# Patient Record
Sex: Male | Born: 1955 | Race: Black or African American | Hispanic: No | Marital: Married | Smoking: Current every day smoker
Health system: Southern US, Community
[De-identification: ages and names within clinical notes are randomized; demographics above are authoritative.]

---

## 2014-03-21 ENCOUNTER — Encounter (HOSPITAL_COMMUNITY): Payer: Self-pay | Admitting: Emergency Medicine

## 2014-03-21 ENCOUNTER — Emergency Department (HOSPITAL_COMMUNITY)
Admission: EM | Admit: 2014-03-21 | Discharge: 2014-03-21 | Disposition: A | Discharge status: Home / Self Care | Payer: Self-pay | Attending: Emergency Medicine | Admitting: Emergency Medicine

## 2014-03-21 DIAGNOSIS — K029 Dental caries, unspecified: Secondary | ICD-10-CM | POA: Insufficient documentation

## 2014-03-21 DIAGNOSIS — F17200 Nicotine dependence, unspecified, uncomplicated: Secondary | ICD-10-CM | POA: Insufficient documentation

## 2014-03-21 DIAGNOSIS — Z792 Long term (current) use of antibiotics: Secondary | ICD-10-CM | POA: Insufficient documentation

## 2014-03-21 DIAGNOSIS — R030 Elevated blood-pressure reading, without diagnosis of hypertension: Secondary | ICD-10-CM | POA: Insufficient documentation

## 2014-03-21 DIAGNOSIS — IMO0001 Reserved for inherently not codable concepts without codable children: Secondary | ICD-10-CM

## 2014-03-21 MED ORDER — TRAMADOL HCL 50 MG PO TABS: 50.0000 mg | ORAL_TABLET | Freq: Four times a day (QID) | ORAL | Status: AC | PRN

## 2014-03-21 NOTE — ED Notes (Signed)
PT ambulated with baseline gait; VSS; A&Ox3; no signs of distress; respirations even and unlabored; skin warm and dry; no questions upon discharge.  

## 2014-03-21 NOTE — Discharge Instructions (Signed)
Dental Extraction A dental extraction procedure refers to a routine tooth extraction performed by your dentist. The procedure depends on where and how the tooth is positioned. The procedure can be very quick, sometimes lasting only seconds. Reasons for dental extraction include:  Tooth decay.  Infections (abcesses).  The need to make room for other teeth.  Gum disease s where the supporting bone has been destroyed.  Fractures of the tooth leaving it unrestorable.  Extra teeth (supernumerary) or grossly malformed teeth.  Baby teeththat have not fallen out in time and have not permitted the the permanent teeth to erupt properly.  In preparation for braces where there is not enough room to align the teeth properly.  Not enough room for wisdom teeth (particularly those that are impacted).  Prior to receiving radiation to the head and neck,teeth in the field of radiation may need to be extracted. LET YOUR CAREGIVER KNOW ABOUT:  Any allergies.  All medicines you are taking:  Including herbs, eye drops, over-the-counter medications, and creams.  Blood thinners (anticoagulants), aspirin or other drugs that may affect blood clotting.  Use of steroids (through mouth or as creams).  Previous problems with anesthetics, including local anesthetics.  History of bleeding or blood problems.  Previous surgery.  Possibility of pregnancy if this applies.  Smoking history.  Any health problems. RISKS AND COMPLICATIONS As with any procedure, complications may occur, but they can usually be managed by your caregiver. General surgical complications may include:  Reaction to anesthesia.  Damage to surrounding teeth, nerves, tissues, or structures.  Infection.  Bleeding. With appropriate treatment and care after surgery, the following complications are very uncommon:  Dry socket (blood clot does not form or stay in place over empty socket). This can delay healing.  Incomplete  extraction of roots.  Jawbone injury, pain, or weakness. BEFORE THE PROCEDURE  Your dental care provider will:  Take a medical and dental history.  Take an X-ray to evaluate the circumstances and how to best extract the tooth.  Do an oral exam.  Depending on the situation, antibiotics may be given before or after the extraction, or before and after.  Your caregivers may review the procedure, the local anaesthesia and/or sedation being used, and what to expect after the procedure with you.  If needed, your dentist may give you a form of sedation, either by medicine you swallow, gas, or intravenously (IV). This will help to relieve anxiety. Complicated extractions may require the use of general anaesthesia. It is important to follow your caregiver's instructions prior to your procedure to avoid complications. Steps before your procedure may include:  Alert your caregiver if you feel ill (sore throat, fever, upset stomach, etc.) in the days leading up to your procedure.  Stop taking certain medications for several days prior to your procedure such as blood thinners.  Take certain medications, such as antibiotics.  Avoid eating and drinking for several hours before the procedure. This will help you to avoid complications from the sedation or anaesthesia.  Sign a patient consent form.  Have a friend or family member drive you to the dentist and drive you home after the procedure.  Wear comfortable, loose clothing. Limit makeup and jewelry.  Quit smoking. If you are a smoker, this will raise the chances of a healing problem after your procedure. If you are thinking about quitting, talk to your surgeon about how long before the operation you should stop smoking. You may also get help from your primary caregiver. PROCEDURE  Dental extraction is typically done as an outpatient procedure. IV sedation, local anesthesia, or both may be used. It will keep you comfortable and free of pain  during the procedure.  There are 2 types of extractions:  Simple extraction involves a tooth that is visible in the mouth and above the gum line. After local anesthetic is given by injection, and the area is numbed, the dentist will loosen the tooth with a special instrument (elevator). Then another instrument (forceps) will be used to grasp the tooth and remove it from its socket. During the procedure you will feel some pressure, but you should not feel pain. If you do feel pain, tell your dentist. The open socket will be cleaned. Dressings (gauze) will be placed in the socket to reduce bleeding.  Surgical extractions are used if the tooth has not come into the mouth or the tooth is broken off below the gum line. The dentist will make a cut (incision) in the gum and may have to remove some of the bone around the tooth to aid in the removal of the tooth. After removal, stitches (sutures) may be required to close area to help in healing and control bleeding. For some surgical extractions, you may need a general anesthetic or IV sedation (through the vein). After both types of extractions, you may be given pain medication or other drugs to help healing. Other post operative instructions will be given by your dental caregiver. AFTER THE PROCEDURE  You will have gauze in your mouth where the tooth was removed. Gentle pressure on the gauze for up to 1 hour will help to control bleeding.  A blood clot will begin to form over the open socket. This is normal. Do not touch the area or rinse it.  Your pain will be controlled with medication and self-care.  You will be given detailed instructions for care after surgery. PROGNOSIS While some discomfort is normal after tooth extraction, most patients recover fully in just a few days. SEEK IMMEDIATE DENTAL CARE  You have uncontrolled bleeding, marked swelling, or severe pain.  You develop a fever, difficulty swallowing, or other sever Dental Care and  Dentist Visits Dental care supports good overall health. Regular dental visits can also help you avoid dental pain, bleeding, infection, and other more serious health problems in the future. It is important to keep the mouth healthy because diseases in the teeth, gums, and other oral tissues can spread to other areas of the body. Some problems, such as diabetes, heart disease, and pre-term labor have been associated with poor oral health.  See your dentist every 6 months. If you experience emergency problems such as a toothache or broken tooth, go to the dentist right away. If you see your dentist regularly, you may catch problems early. It is easier to be treated for problems in the early stages.  WHAT TO EXPECT AT A DENTIST VISIT  Your dentist will look for many common oral health problems and recommend proper treatment. At your regular dental visit, you can expect: Gentle cleaning of the teeth and gums. This includes scraping and polishing. This helps to remove the sticky substance around the teeth and gums (plaque). Plaque forms in the mouth shortly after eating. Over time, plaque hardens on the teeth as tartar. If tartar is not removed regularly, it can cause problems. Cleaning also helps remove stains. Periodic X-rays. These pictures of the teeth and supporting bone will help your dentist assess the health of your teeth. Periodic fluoride  treatments. Fluoride is a natural mineral shown to help strengthen teeth. Fluoride treatmentinvolves applying a fluoride gel or varnish to the teeth. It is most commonly done in children. Examination of the mouth, tongue, jaws, teeth, and gums to look for any oral health problems, such as: Cavities (dental caries). This is decay on the tooth caused by plaque, sugar, and acid in the mouth. It is best to catch a cavity when it is small. Inflammation of the gums caused by plaque buildup (gingivitis). Problems with the mouth or malformed or misaligned teeth. Oral  cancer or other diseases of the soft tissues or jaws. KEEP YOUR TEETH AND GUMS HEALTHY For healthy teeth and gums, follow these general guidelines as well as your dentist's specific advice: Have your teeth professionally cleaned at the dentist every 6 months. Brush twice daily with a fluoride toothpaste. Floss your teeth daily. Ask your dentist if you need fluoride supplements, treatments, or fluoride toothpaste. Eat a healthy diet. Reduce foods and drinks with added sugar. Avoid smoking. TREATMENT FOR ORAL HEALTH PROBLEMS If you have oral health problems, treatment varies depending on the conditions present in your teeth and gums. Your caregiver will most likely recommend good oral hygiene at each visit. For cavities, gingivitis, or other oral health disease, your caregiver will perform a procedure to treat the problem. This is typically done at a separate appointment. Sometimes your caregiver will refer you to another dental specialist for specific tooth problems or for surgery. SEEK IMMEDIATE DENTAL CARE IF: You have pain, bleeding, or soreness in the gum, tooth, jaw, or mouth area. A permanent tooth becomes loose or separated from the gum socket. You experience a blow or injury to the mouth or jaw area. Document Released: 05/28/2011 Document Revised: 12/08/2011 Document Reviewed: 05/28/2011 [facility] Patient Information 2015 [place], [place]. This information is not intended to replace advice given to you by your health care provider. Make sure you discuss any questions you have with your health care provider.  e symptoms.  You have questions or concerns. Document Released: 09/15/2005 Document Revised: 12/08/2011 Document Reviewed: 12/20/2010 [facility] Patient Information 2015 [place], [place]. This information is not intended to replace advice given to you by your health care provider. Make sure you discuss any questions you have with your health care provider.

## 2014-03-21 NOTE — ED Provider Notes (Signed)
CSN: [id number]     Arrival date & time 03/21/14  2143 History   First MD Initiated Contact with Patient 03/21/14 2201     This chart was scribed for non-physician practitioner, Junius FinnerErin O'Malley PA-C, working with Lyanne CoKevin M Campos, MD by Arlan OrganAshley Leger, ED Scribe. This patient was seen in room TR07C/TR07C and the patient's care was started at 10:54 PM.   Chief Complaint  Patient presents with  . Dental Pain    The history is provided by the patient. No language interpreter was used.    HPI Comments: Darryl Mercado is a 58 y.o. male who presents to the Emergency Department complaining of constant, moderate R sided dental pain x 3 weeks that is progressively worsening. Pt states he had plans to have tooth extracted. However, he was unable to afford costs. Pt was treated with Ibuprofen and a course of Penicillin to manage symptoms during last visit. He plans to follow up again next week for further evaluation. At this time he denies any fever, chills, or inability to swallow. He has no pertinent past medical history. No other concerns this visit.  History reviewed. No pertinent past medical history. History reviewed. No pertinent past surgical history. History reviewed. No pertinent family history. History  Substance Use Topics  . Smoking status: Current Every Day Smoker  . Smokeless tobacco: Not on file  . Alcohol Use: Yes    Review of Systems  Constitutional: Negative for fever and chills.  HENT: Positive for dental problem. Negative for congestion, ear pain and trouble swallowing.   Respiratory: Negative for cough.   Skin: Negative for rash.  Psychiatric/Behavioral: Negative for confusion.      Allergies  Review of patient's allergies indicates no known allergies.  Home Medications   Prior to Admission medications   Medication Sig Start Date End Date Taking? Authorizing Provider  acetaminophen (TYLENOL) 325 MG tablet Take 650 mg by mouth every 6 (six) hours as needed for mild pain.    Yes Historical Provider, MD  ibuprofen (ADVIL,MOTRIN) 200 MG tablet Take 200 mg by mouth every 6 (six) hours as needed for moderate pain.   Yes Historical Provider, MD  penicillin v potassium (VEETID) 500 MG tablet Take 500 mg by mouth 3 (three) times daily.   Yes Historical Provider, MD  traMADol (ULTRAM) 50 MG tablet Take 1 tablet (50 mg total) by mouth every 6 (six) hours as needed. 03/21/14   Junius FinnerErin O'Malley, PA-C   Triage Vitals: BP 172/116  Pulse 66  Temp(Src) 98.5 F (36.9 C) (Oral)  Resp 16  Ht 5\' 9"  (1.753 m)  Wt 167 lb (75.751 kg)  BMI 24.65 kg/m2  SpO2 99%   Physical Exam  Nursing note and vitals reviewed. Constitutional: He is oriented to person, place, and time. He appears well-developed and well-nourished.  HENT:  Head: Normocephalic and atraumatic.  Diffuse dental decay. Hole in tooth #28. No active discharge.  No drainable gingival abscess present.  Eyes: EOM are normal.  Neck: Normal range of motion.  Cardiovascular: Normal rate.   Pulmonary/Chest: Effort normal.  Musculoskeletal: Normal range of motion.  Neurological: He is alert and oriented to person, place, and time.  Skin: Skin is warm and dry.  Psychiatric: He has a normal mood and affect. His behavior is normal.    ED Course  Procedures (including critical care time)  DIAGNOSTIC STUDIES: Oxygen Saturation is 99% on RA, Normal by my interpretation.    COORDINATION OF CARE: 10:57 PM- Will give Tramadol to manage  symptoms. Discussed treatment plan with pt at bedside and pt agreed to plan.     Labs Review Labs Reviewed - No data to display  Imaging Review No results found.   EKG Interpretation None      MDM   Final diagnoses:  Dental decay  Pain due to dental caries  Elevated blood pressure   Pt c/o dental pain. Diffuse dental decay present. Pt states he just completed PCN.  Will give pt tramadol for pain and encouraged to f/u with dentist next week as previously scheduled for tooth removal.   Pt also found to have elevated BP during ED visit. Pt states he is unaware of hx of HTN.  Advised to f/u with PCP for recheck.  Return precautions provided. Pt verbalized understanding and agreement with tx plan.   I personally performed the services described in this documentation, which was scribed in my presence. The recorded information has been reviewed and is accurate.    Junius Finnerrin O'Malley, PA-C 03/22/14 0001

## 2014-03-21 NOTE — ED Notes (Signed)
Pt states dental pain was suppose to have tooth extracted but could not afford it this month.

## 2014-03-24 NOTE — ED Provider Notes (Signed)
Medical screening examination/treatment/procedure(s) were performed by non-physician practitioner and as supervising physician I was immediately available for consultation/collaboration.   EKG Interpretation None        Cuauhtemoc Huegel M Maegan Buller, MD 03/24/14 1836 

## 2014-10-22 ENCOUNTER — Encounter (HOSPITAL_COMMUNITY): Payer: Self-pay | Admitting: Family Medicine

## 2014-10-22 ENCOUNTER — Emergency Department (HOSPITAL_COMMUNITY): Payer: Self-pay

## 2014-10-22 ENCOUNTER — Emergency Department (HOSPITAL_COMMUNITY)
Admission: EM | Admit: 2014-10-22 | Discharge: 2014-10-22 | Disposition: A | Discharge status: Home / Self Care | Payer: Self-pay | Attending: Emergency Medicine | Admitting: Emergency Medicine

## 2014-10-22 DIAGNOSIS — W1839XA Other fall on same level, initial encounter: Secondary | ICD-10-CM | POA: Insufficient documentation

## 2014-10-22 DIAGNOSIS — S8262XA Displaced fracture of lateral malleolus of left fibula, initial encounter for closed fracture: Secondary | ICD-10-CM | POA: Insufficient documentation

## 2014-10-22 DIAGNOSIS — S82402A Unspecified fracture of shaft of left fibula, initial encounter for closed fracture: Secondary | ICD-10-CM

## 2014-10-22 DIAGNOSIS — Y9289 Other specified places as the place of occurrence of the external cause: Secondary | ICD-10-CM | POA: Insufficient documentation

## 2014-10-22 DIAGNOSIS — Z720 Tobacco use: Secondary | ICD-10-CM | POA: Insufficient documentation

## 2014-10-22 DIAGNOSIS — Z791 Long term (current) use of non-steroidal anti-inflammatories (NSAID): Secondary | ICD-10-CM | POA: Insufficient documentation

## 2014-10-22 DIAGNOSIS — Y9389 Activity, other specified: Secondary | ICD-10-CM | POA: Insufficient documentation

## 2014-10-22 DIAGNOSIS — Y998 Other external cause status: Secondary | ICD-10-CM | POA: Insufficient documentation

## 2014-10-22 MED ORDER — HYDROCODONE-ACETAMINOPHEN 5-325 MG PO TABS: 1.0000 | ORAL_TABLET | Freq: Four times a day (QID) | ORAL | Status: AC | PRN

## 2014-10-22 MED ORDER — HYDROCODONE-ACETAMINOPHEN 5-325 MG PO TABS
2.0000 | ORAL_TABLET | Freq: Once | ORAL | Status: AC
  Administered 2014-10-22: 2 via ORAL
  Filled 2014-10-22: qty 2

## 2014-10-22 NOTE — Progress Notes (Signed)
Orthopedic Tech Progress Note Patient Details:  Darryl Mercado [birth year] [id number]  Ortho Devices Type of Ortho Device: Ace wrap, Crutches, Post (short leg) splint, Stirrup splint Ortho Device/Splint Location: lle Ortho Device/Splint Interventions: Application   Darryl Mercado 10/22/2014, 2:00 PM

## 2014-10-22 NOTE — ED Provider Notes (Signed)
CSN: [id number]     Arrival date & time 10/22/14  1201 History  This chart was scribed for non-physician practitioner Santiago GladHeather Colman Birdwell, PA-C working with Audree CamelScott T Goldston, MD by Conchita ParisNadim Abuhashem, ED Scribe. This patient was seen in TR07C/TR07C and the patient's care was started at 12:56 PM.   Chief Complaint  Patient presents with  . Ankle Pain  . Foot Pain   Patient is a 59 y.o. male presenting with ankle pain and lower extremity pain. The history is provided by the patient. No language interpreter was used.  Ankle Pain Associated symptoms: no back pain   Foot Pain    HPI Comments: Darryl Mercado is a 59 y.o. male who presents to the Emergency Department complaining of left ankle pain, onset 10:00 AM today. Pt twisted his ankle and fell. He reports falling on his left ankle. Pt has trouble with ambulating and left ankle swelling as associated symptoms. He has not taken anything for relief. Pt denies numbness, pain in knees or back, head trauma or LOC.   History reviewed. No pertinent past medical history. History reviewed. No pertinent past surgical history. History reviewed. No pertinent family history. History  Substance Use Topics  . Smoking status: Current Every Day Smoker  . Smokeless tobacco: Not on file  . Alcohol Use: Yes    Review of Systems  Musculoskeletal: Positive for joint swelling, arthralgias and gait problem. Negative for back pain.  Neurological: Negative for numbness.   Allergies  Review of patient's allergies indicates no known allergies.  Home Medications   Prior to Admission medications   Medication Sig Start Date End Date Taking? Authorizing Provider  acetaminophen (TYLENOL) 325 MG tablet Take 650 mg by mouth every 6 (six) hours as needed for mild pain.    Historical Provider, MD  ibuprofen (ADVIL,MOTRIN) 200 MG tablet Take 200 mg by mouth every 6 (six) hours as needed for moderate pain.    Historical Provider, MD  penicillin v potassium (VEETID) 500 MG  tablet Take 500 mg by mouth 3 (three) times daily.    Historical Provider, MD  traMADol (ULTRAM) 50 MG tablet Take 1 tablet (50 mg total) by mouth every 6 (six) hours as needed. 03/21/14   Junius FinnerErin O'Malley, PA-C   BP 138/91 mmHg  Pulse 95  Temp(Src) 97.6 F (36.4 C)  Resp 18  SpO2 95% Physical Exam  Constitutional: He is oriented to person, place, and time. He appears well-developed and well-nourished. No distress.  HENT:  Head: Normocephalic and atraumatic.  Eyes: Pupils are equal, round, and reactive to light.  Neck: Normal range of motion.  Cardiovascular: Normal rate, regular rhythm and normal heart sounds.   Pulses:      Dorsalis pedis pulses are 2+ on the left side.  Pulmonary/Chest: Effort normal and breath sounds normal. No respiratory distress.  Musculoskeletal: Normal range of motion.  Left lateral malleolus is TTP with associated edema. ROM is limited secondary to pain. He is able to wiggle left toes. Distal sensation of left toes intact. Full ROM of left knee and left hip.  Neurological: He is alert and oriented to person, place, and time.  Skin: Skin is warm and dry.  Psychiatric: He has a normal mood and affect. His behavior is normal.  Nursing note and vitals reviewed.   ED Course  Procedures  DIAGNOSTIC STUDIES: Oxygen Saturation is 95% on room air, normal by my interpretation.    COORDINATION OF CARE: 1:01 PM Discussed treatment plan with pt at bedside and  pt agreed to plan.  Labs Review Labs Reviewed - No data to display  Imaging Review Dg Ankle Complete Left  10/22/2014   CLINICAL DATA:  Twisting injury left ankle.  Lateral pain.  EXAM: LEFT ANKLE COMPLETE - 3+ VIEW  COMPARISON:  None.  FINDINGS: Oblique fracture lateral malleolus with overlying soft tissue swelling. No widening of the medial mortise. No visible medial malleolar or posterior malleolar fracture.  IMPRESSION: 1. Oblique fracture of lateral malleolus, without widening of the medial mortise or  additional visible fracture.   Electronically Signed   By: Herbie Baltimore M.D.   On: 10/22/2014 12:54     EKG Interpretation None      MDM   Final diagnoses:  None   Patient presents today with left ankle pain that has been present since rolling his ankle today.  Xray showing oblique fracture of the lateral malleolus without widening of the medial mortise.  Fracture is closed.  Neurovascularly intact.  Patient given splint and crutches.  Patient given follow up with Orthopedics.  Return precautions given.     Santiago Glad, PA-C 10/22/14 1508  Audree Camel, MD 10/25/14 [phone]

## 2014-10-22 NOTE — ED Notes (Signed)
Pt having left ankle and foot pain. sts twisted it PTA.

## 2014-10-29 ENCOUNTER — Emergency Department (HOSPITAL_COMMUNITY)
Admission: EM | Admit: 2014-10-29 | Discharge: 2014-10-29 | Disposition: A | Discharge status: Home / Self Care | Payer: Self-pay | Attending: Emergency Medicine | Admitting: Emergency Medicine

## 2014-10-29 ENCOUNTER — Encounter (HOSPITAL_COMMUNITY): Payer: Self-pay | Admitting: *Deleted

## 2014-10-29 DIAGNOSIS — X58XXXD Exposure to other specified factors, subsequent encounter: Secondary | ICD-10-CM | POA: Insufficient documentation

## 2014-10-29 DIAGNOSIS — Z720 Tobacco use: Secondary | ICD-10-CM | POA: Insufficient documentation

## 2014-10-29 DIAGNOSIS — S82402D Unspecified fracture of shaft of left fibula, subsequent encounter for closed fracture with routine healing: Secondary | ICD-10-CM | POA: Insufficient documentation

## 2014-10-29 DIAGNOSIS — Z792 Long term (current) use of antibiotics: Secondary | ICD-10-CM | POA: Insufficient documentation

## 2014-10-29 NOTE — Discharge Instructions (Signed)
Please read and follow all provided instructions.  Your diagnoses today include:  1. Fibula fracture, left, closed, initial encounter     Tests performed today include:  Vital signs. See below for your results today.   Medications prescribed:   None  Take any prescribed medications only as directed.  Home care instructions:   Follow any educational materials contained in this packet  Follow R.I.C.E. Protocol:  R - rest your injury   I  - use ice on injury without applying directly to skin  C - compress injury with bandage or splint  E - elevate the injury as much as possible  Follow-up instructions: Call Dr. Veda Canninghandler's office tomorrow for an appointment. If they deny you, ask to speak with an office manager to explain the situation.   Return instructions:   Please return if your toes or feet are numb or tingling, appear gray or blue, or you have severe pain (also elevate the leg and loosen splint or wrap if you were given one)  Please return to the Emergency Department if you experience worsening symptoms.   Please return if you have any other emergent concerns.  Additional Information:  Your vital signs today were: BP 168/91 mmHg   Pulse 80   Temp(Src) 98 F (36.7 C) (Oral)   Resp 15   SpO2 99% If your blood pressure (BP) was elevated above 135/85 this visit, please have this repeated by your doctor within one month. --------------

## 2014-10-29 NOTE — ED Notes (Signed)
Pt returns today because he does not have insurance and can not follow up with Ortho. Pt reports he still has ankle pain 7/10 . Pt also reports his job wants to know when he can return to work.

## 2014-10-29 NOTE — ED Provider Notes (Signed)
CSN: [id number]     Arrival date & time 10/29/14  [id number] History   First MD Initiated Contact with Patient 10/29/14 [phone]     Chief Complaint  Patient presents with  . Ankle Pain     (Consider location/radiation/quality/duration/timing/severity/associated sxs/prior Treatment) HPI Comments: Patient presents with complaint of left ankle pain. Patient was seen in emergency department one week ago and was diagnosed with a left fibular fracture. He was placed in a splint and discharged home with crutches and pain medication. Patient has been taking Vicodin with relief of pain. He states that he called the office of Dr. Ave Filterhandler who was on-call that day, he was unable to get an appointment for follow-up. He returns today here for follow-up. He denies numbness or tingling in his foot. He uses the pain medications on an as-needed basis. He also questions when he will be able to return to work. No other medical complaints.  Patient is a 59 y.o. male presenting with ankle pain. The history is provided by the patient and medical records.  Ankle Pain Associated symptoms: no back pain and no neck pain     History reviewed. No pertinent past medical history. History reviewed. No pertinent past surgical history. History reviewed. No pertinent family history. History  Substance Use Topics  . Smoking status: Current Every Day Smoker  . Smokeless tobacco: Not on file  . Alcohol Use: Yes    Review of Systems  Constitutional: Negative for activity change.  Musculoskeletal: Positive for arthralgias. Negative for back pain, joint swelling, gait problem and neck pain.  Skin: Negative for wound.  Neurological: Negative for weakness and numbness.      Allergies  Review of patient's allergies indicates no known allergies.  Home Medications   Prior to Admission medications   Medication Sig Start Date End Date Taking? Authorizing Provider  acetaminophen (TYLENOL) 325 MG tablet Take 650 mg by mouth every  6 (six) hours as needed for mild pain.    Historical Provider, MD  HYDROcodone-acetaminophen (NORCO/VICODIN) 5-325 MG per tablet Take 1-2 tablets by mouth every 6 (six) hours as needed. 10/22/14   Heather Laisure, PA-C  ibuprofen (ADVIL,MOTRIN) 200 MG tablet Take 200 mg by mouth every 6 (six) hours as needed for moderate pain.    Historical Provider, MD  penicillin v potassium (VEETID) 500 MG tablet Take 500 mg by mouth 3 (three) times daily.    Historical Provider, MD  traMADol (ULTRAM) 50 MG tablet Take 1 tablet (50 mg total) by mouth every 6 (six) hours as needed. 03/21/14   Junius FinnerErin O'Malley, PA-C   BP 168/91 mmHg  Pulse 80  Temp(Src) 98 F (36.7 C) (Oral)  Resp 15  SpO2 99% Physical Exam  Constitutional: He appears well-developed and well-nourished.  HENT:  Head: Normocephalic and atraumatic.  Eyes: Conjunctivae are normal.  Neck: Normal range of motion. Neck supple.  Cardiovascular: Normal pulses.   Musculoskeletal: He exhibits no edema or tenderness.  Left short leg splint in place. Toes exposed with normal capillary refill. Splint not removed. Patient does not have any apparent excessive swelling or pain.  Neurological: He is alert. No sensory deficit.  Motor, sensation, and vascular distal to the injury is fully intact.   Skin: Skin is warm and dry.  Psychiatric: He has a normal mood and affect.  Nursing note and vitals reviewed.   ED Course  Procedures (including critical care time) Labs Review Labs Reviewed - No data to display  Imaging Review No results found.  EKG Interpretation None      9:27 AM Patient seen and examined. Discussed with Dr. Jeraldine Loots.  Vital signs reviewed and are as follows: BP 168/91 mmHg  Pulse 80  Temp(Src) 98 F (36.7 C) (Oral)  Resp 15  SpO2 99%  Note sent to go for orthopedic on patient's behalf asking for a follow-up appointment. Patient asked to call tomorrow for an appointment, speak with office staff manager if he is not able to  schedule an appointment. Also given a work note stating that he cannot return until cleared by orthopedist.  MDM   Final diagnoses:  Fibula fracture, left, closed, with routine healing, subsequent encounter   Patient returns to emergency department for follow-up after he was unable to get a follow-up appointment with the orthopedist.  Plan as above. No apparent complications with healing at this point. Patient has pain medication at home.     Renne Crigler, PA-C 10/29/14 [id number]  Gerhard Munch, MD 10/29/14 [phone]

## 2015-02-22 ENCOUNTER — Emergency Department (HOSPITAL_COMMUNITY)
Admission: EM | Admit: 2015-02-22 | Discharge: 2015-02-22 | Disposition: A | Discharge status: Home / Self Care | Payer: No Typology Code available for payment source | Attending: Emergency Medicine | Admitting: Emergency Medicine

## 2015-02-22 ENCOUNTER — Encounter (HOSPITAL_COMMUNITY): Payer: Self-pay | Admitting: Emergency Medicine

## 2015-02-22 DIAGNOSIS — Y9389 Activity, other specified: Secondary | ICD-10-CM | POA: Diagnosis not present

## 2015-02-22 DIAGNOSIS — Z792 Long term (current) use of antibiotics: Secondary | ICD-10-CM | POA: Diagnosis not present

## 2015-02-22 DIAGNOSIS — I10 Essential (primary) hypertension: Secondary | ICD-10-CM | POA: Insufficient documentation

## 2015-02-22 DIAGNOSIS — Y998 Other external cause status: Secondary | ICD-10-CM | POA: Insufficient documentation

## 2015-02-22 DIAGNOSIS — Z720 Tobacco use: Secondary | ICD-10-CM | POA: Insufficient documentation

## 2015-02-22 DIAGNOSIS — S199XXA Unspecified injury of neck, initial encounter: Secondary | ICD-10-CM | POA: Diagnosis not present

## 2015-02-22 DIAGNOSIS — Y92410 Unspecified street and highway as the place of occurrence of the external cause: Secondary | ICD-10-CM | POA: Diagnosis not present

## 2015-02-22 DIAGNOSIS — Z79899 Other long term (current) drug therapy: Secondary | ICD-10-CM | POA: Insufficient documentation

## 2015-02-22 LAB — BASIC METABOLIC PANEL
Anion gap: 5 (ref 5–15)
BUN: 13 mg/dL (ref 6–20)
CALCIUM: 9 mg/dL (ref 8.9–10.3)
CO2: 30 mmol/L (ref 22–32)
Chloride: 107 mmol/L (ref 101–111)
Creatinine, Ser: 1.12 mg/dL (ref 0.61–1.24)
GFR calc Af Amer: >60 mL/min (ref >60)
GFR calc non Af Amer: >60 mL/min (ref >60)
GLUCOSE: 80 mg/dL (ref 65–99)
Potassium: 3.8 mmol/L (ref 3.5–5.1)
Sodium: 142 mmol/L (ref 135–145)

## 2015-02-22 LAB — CBC WITH DIFFERENTIAL/PLATELET
BASOS ABS: 0 10*3/uL (ref 0.0–0.1)
Basophils Relative: 1 % (ref 0–1)
Eosinophils Absolute: 0 10*3/uL (ref 0.0–0.7)
Eosinophils Relative: 1 % (ref 0–5)
HEMATOCRIT: 40.1 % (ref 39.0–52.0)
HEMOGLOBIN: 13.2 g/dL (ref 13.0–17.0)
LYMPHS PCT: 35 % (ref 12–46)
Lymphs Abs: 2.1 10*3/uL (ref 0.7–4.0)
MCH: 28.6 pg (ref 26.0–34.0)
MCHC: 32.9 g/dL (ref 30.0–36.0)
MCV: 87 fL (ref 78.0–100.0)
MONO ABS: 0.4 10*3/uL (ref 0.1–1.0)
MONOS PCT: 6 % (ref 3–12)
NEUTROS ABS: 3.5 10*3/uL (ref 1.7–7.7)
NEUTROS PCT: 57 % (ref 43–77)
Platelets: 243 10*3/uL (ref 150–400)
RBC: 4.61 MIL/uL (ref 4.22–5.81)
RDW: 14.4 % (ref 11.5–15.5)
WBC: 6.1 10*3/uL (ref 4.0–10.5)

## 2015-02-22 MED ORDER — AMLODIPINE BESYLATE 5 MG PO TABS
5.0000 mg | ORAL_TABLET | Freq: Every day | ORAL | Status: AC
Start: 2015-02-22 — End: ?
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
## 2015-07-03 IMAGING — CR DG ANKLE COMPLETE 3+V*L*
3 series · 3 of 3 positions shown · non-contrast
Comparison: None.

CLINICAL DATA: Twisting injury left ankle.  Lateral pain.

EXAM:
LEFT ANKLE COMPLETE - 3+ VIEW

[ankle ap]
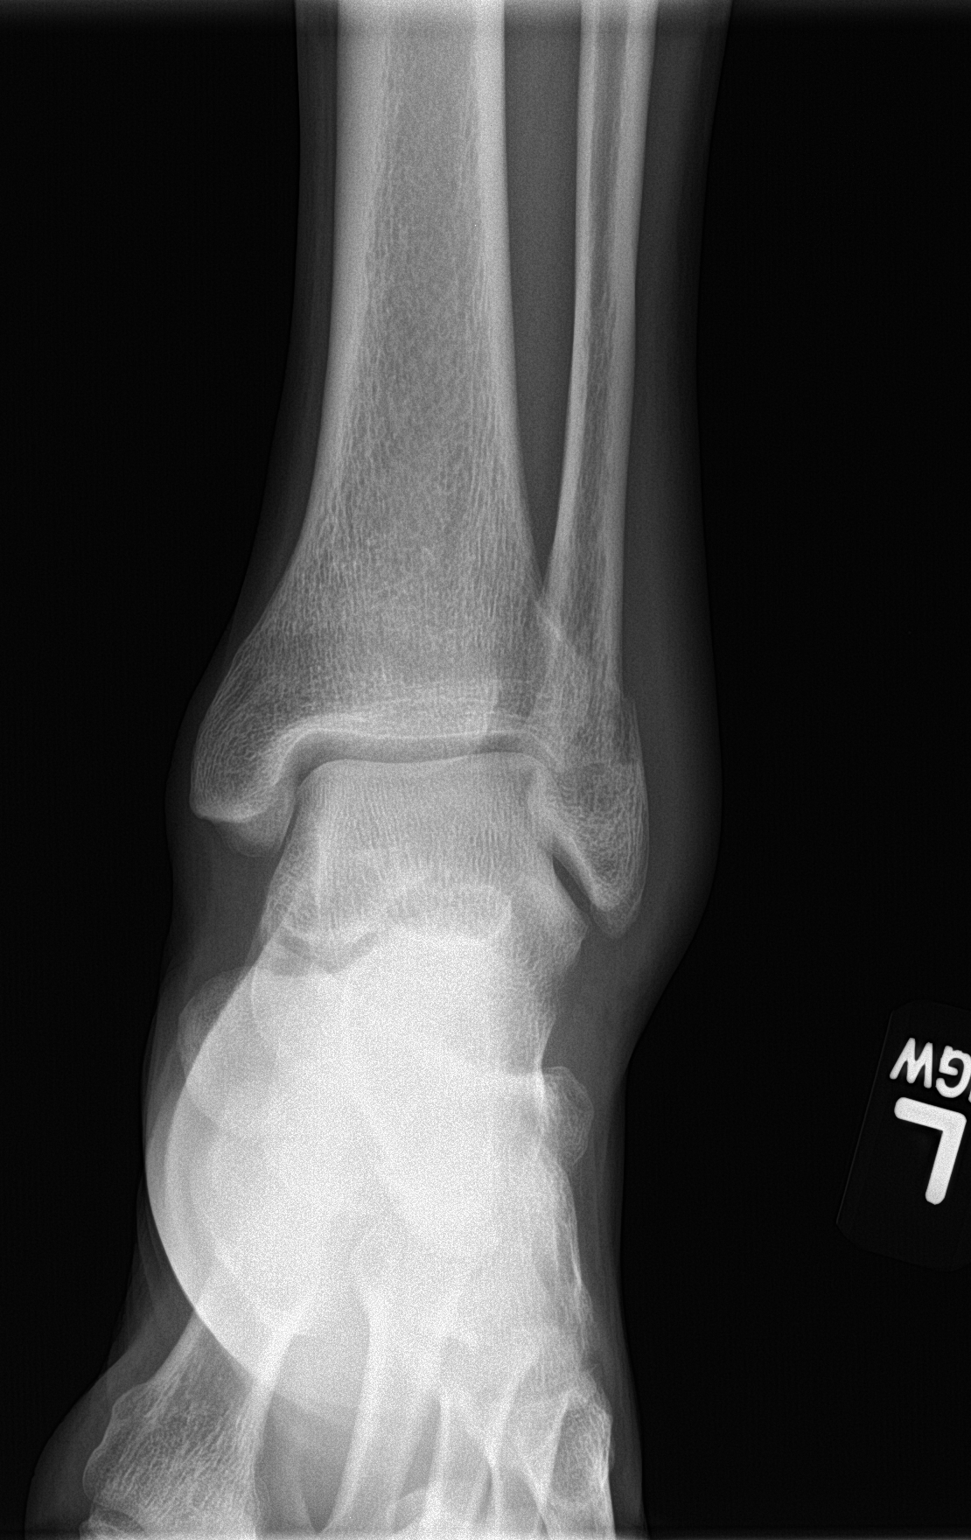

[ankle obl]
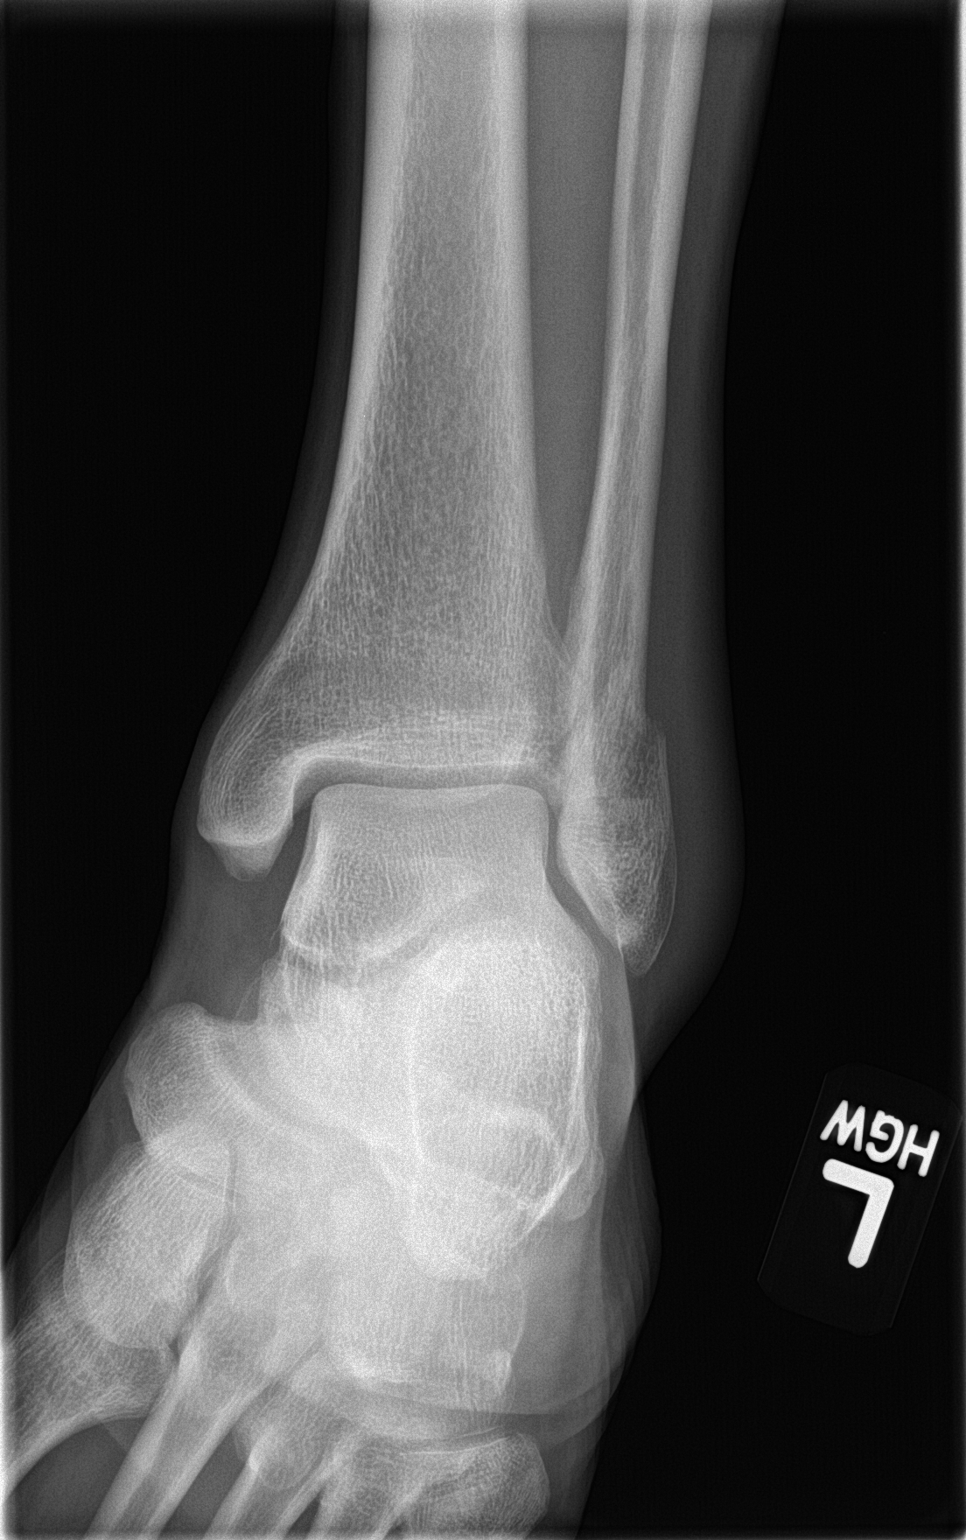

[ankle lat]
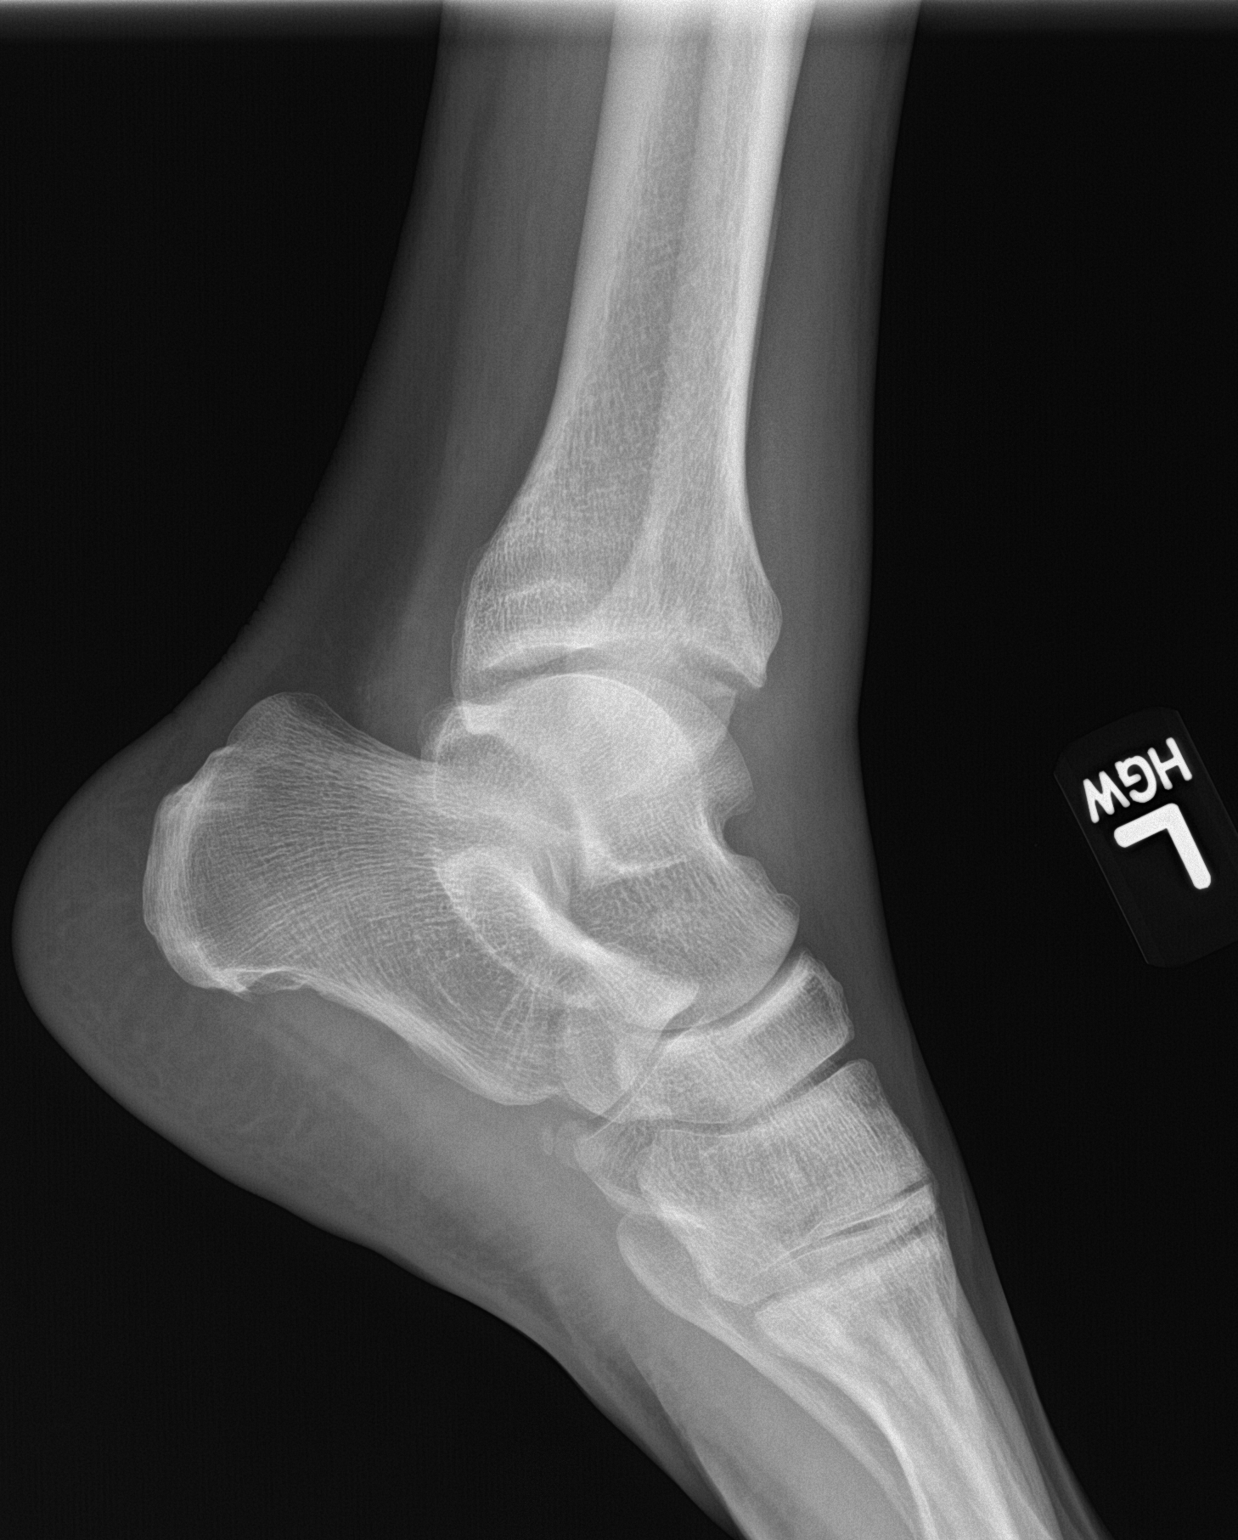

[3 of 3 positions shown; findings below may reference images not displayed]

FINDINGS: Oblique fracture lateral malleolus with overlying soft tissue
swelling. No widening of the medial mortise. No visible medial
malleolar or posterior malleolar fracture.
IMPRESSION: 1. Oblique fracture of lateral malleolus, without widening of the
medial mortise or additional visible fracture.

## 2019-05-31 DEATH — deceased
